# Patient Record
Sex: Male | Born: 1940 | Race: White | Hispanic: No | Marital: Married
Health system: Southern US, Community
[De-identification: ages and names within clinical notes are randomized; demographics above are authoritative.]

## PROBLEM LIST (undated history)

## (undated) DIAGNOSIS — E119 Type 2 diabetes mellitus without complications: Secondary | ICD-10-CM

## (undated) DIAGNOSIS — I10 Essential (primary) hypertension: Secondary | ICD-10-CM

## (undated) DIAGNOSIS — D649 Anemia, unspecified: Secondary | ICD-10-CM

## (undated) HISTORY — PX: CORONARY ANGIOPLASTY WITH STENT PLACEMENT: SHX49

## (undated) HISTORY — PX: APPENDECTOMY: SHX54

## (undated) HISTORY — PX: CHOLECYSTECTOMY: SHX55

## (undated) HISTORY — PX: PROSTATECTOMY: SHX69

## (undated) HISTORY — PX: JOINT REPLACEMENT: SHX530

---

## 2017-10-11 ENCOUNTER — Encounter (HOSPITAL_COMMUNITY): Payer: Self-pay

## 2017-10-11 ENCOUNTER — Emergency Department (HOSPITAL_COMMUNITY): Payer: Medicare Other

## 2017-10-11 ENCOUNTER — Emergency Department (HOSPITAL_COMMUNITY)
Admission: EM | Admit: 2017-10-11 | Discharge: 2017-10-11 | Disposition: A | Discharge status: Home / Self Care | Payer: Medicare Other | Attending: Emergency Medicine | Admitting: Emergency Medicine

## 2017-10-11 ENCOUNTER — Other Ambulatory Visit: Payer: Self-pay

## 2017-10-11 DIAGNOSIS — I10 Essential (primary) hypertension: Secondary | ICD-10-CM | POA: Insufficient documentation

## 2017-10-11 DIAGNOSIS — S161XXA Strain of muscle, fascia and tendon at neck level, initial encounter: Secondary | ICD-10-CM

## 2017-10-11 DIAGNOSIS — Y9389 Activity, other specified: Secondary | ICD-10-CM | POA: Insufficient documentation

## 2017-10-11 DIAGNOSIS — S0990XA Unspecified injury of head, initial encounter: Secondary | ICD-10-CM | POA: Diagnosis present

## 2017-10-11 DIAGNOSIS — Y999 Unspecified external cause status: Secondary | ICD-10-CM | POA: Insufficient documentation

## 2017-10-11 DIAGNOSIS — W19XXXA Unspecified fall, initial encounter: Secondary | ICD-10-CM

## 2017-10-11 DIAGNOSIS — E119 Type 2 diabetes mellitus without complications: Secondary | ICD-10-CM | POA: Diagnosis not present

## 2017-10-11 DIAGNOSIS — W109XXA Fall (on) (from) unspecified stairs and steps, initial encounter: Secondary | ICD-10-CM | POA: Insufficient documentation

## 2017-10-11 DIAGNOSIS — Y9222 Religious institution as the place of occurrence of the external cause: Secondary | ICD-10-CM | POA: Diagnosis not present

## 2017-10-11 DIAGNOSIS — M79605 Pain in left leg: Secondary | ICD-10-CM

## 2017-10-11 HISTORY — DX: Essential (primary) hypertension: I10

## 2017-10-11 HISTORY — DX: Anemia, unspecified: D64.9

## 2017-10-11 HISTORY — DX: Type 2 diabetes mellitus without complications: E11.9

## 2017-10-11 NOTE — ED Triage Notes (Signed)
[place] 10 stairs at [place] when he stumbled on the first stair going down and continued to fall head first down all 10 stairs.  Upon arrival to ED pt has a left forehead hematoma, left hand injury, left knee abrasion.  Pt denies LOC and is not currently on blood thinners.

## 2017-10-11 NOTE — Discharge Instructions (Signed)
You were seen in the Emergency Department (ED) today for a head injury.  Based on your evaluation, you may have sustained a concussion (or bruise) to your brain.  If you had a CT scan done, it did not show any evidence of serious injury or bleeding.   ° °Symptoms to expect from a concussion include nausea, mild to moderate headache, difficulty concentrating or sleeping, and mild lightheadedness.  These symptoms should improve over the next few days to weeks, but it may take many weeks before you feel back to normal.  Return to the emergency department or follow-up with your primary care doctor if your symptoms are not improving over this time. ° °Signs of a more serious head injury include vomiting, severe headache, excessive sleepiness or confusion, and weakness or numbness in your face, arms or legs.  Return immediately to the Emergency Department if you experience any of these more concerning symptoms.   ° °Rest, avoid strenuous physical or mental activity, and avoid activities that could potentially result in another head injury until all your symptoms from this head injury are completely resolved for at least 2-3 weeks.  You may take acetaminophen over the counter according to label instructions for mild headache or scalp soreness. ° °

## 2017-10-11 NOTE — ED Provider Notes (Signed)
Emergency Department Provider Note   I have reviewed the triage vital signs and the nursing notes.   HISTORY  Chief Complaint Fall   HPI Aaron Rodriguez is a 77 y.o. male with PMH of DM and HTN to the emergency department for evaluation after mechanical fall down approximately 10 stairs.  The patient was talking with someone at church when he turned in his right foot missed the step and he began to fall.  Patient states that he tumbled down the carpeted stairs and hit his head, left leg, and left hand on the way down.  Reports only mild pain in these areas.  EMS was called and the patient was placed in a cervical spine collar and transported to the emergency department.  Denies any pain in the chest, abdomen, back, hips, or shoulders.  No numbness or tingling.  Patient did not lose consciousness.  No confusion, nausea, vomiting since the event.  Patient is compliant with his a full dose aspirin at home.   Past Medical History:  Diagnosis Date  . Anemia   . Diabetes mellitus without complication (HCC)   . Hypertension     There are no active problems to display for this patient.   Allergies Penicillins and Plavix [clopidogrel bisulfate]  No family history on file.  Social History Social History   Tobacco Use  . Smoking status: Not on file  Substance Use Topics  . Alcohol use: No    Frequency: Never  . Drug use: No    Review of Systems  Constitutional: No fever/chills Eyes: No visual changes. ENT: No sore throat. Cardiovascular: Denies chest pain. Respiratory: Denies shortness of breath. Gastrointestinal: No abdominal pain.  No nausea, no vomiting.  No diarrhea.  No constipation. Genitourinary: Negative for dysuria. Musculoskeletal: Negative for back pain. Positive left lower leg pain and mild left hand pain.  Skin: Negative for rash. Neurological: Negative for focal weakness or numbness. Positive HA.   10-point ROS otherwise  negative.  ____________________________________________   PHYSICAL EXAM:  VITAL SIGNS: ED Triage Vitals  Enc Vitals Group     BP 10/11/17 1310 (!) 156/90     Pulse Rate 10/11/17 1310 74     Resp 10/11/17 1310 18     Temp --      Temp src --      SpO2 10/11/17 1315 98 %     Weight 10/11/17 1310 200 lb (90.7 kg)     Height 10/11/17 1310 5\' 9"  (1.753 m)     Pain Score 10/11/17 1310 2   Constitutional: Alert and oriented. Well appearing and in no acute distress. Eyes: Conjunctivae are normal. Head: Abrasion and swelling over the forehead. No laceration.  Nose: No congestion/rhinnorhea. Mouth/Throat: Mucous membranes are moist.  Oropharynx non-erythematous. Neck: No stridor. C-collar in place.  Cardiovascular: Normal rate, regular rhythm. Good peripheral circulation. Grossly normal heart sounds.   Respiratory: Normal respiratory effort.  No retractions. Lungs CTAB. Gastrointestinal: Soft and nontender. No distention.  Musculoskeletal: No lower extremity tenderness nor edema. No gross deformities of extremities. No tenderness to palpation of the hand and wrists bilaterally. No snuff box tenderness.  Neurologic:  Normal speech and language. No gross focal neurologic deficits are appreciated.  Skin:  Skin is warm and dry. Abrasion to the left anterior lower leg w/o laceration.   ____________________________________________  RADIOLOGY  Dg Tibia/fibula Left  Result Date: 10/11/2017 CLINICAL DATA:  Left leg pain.  Fell on stairs EXAM: LEFT TIBIA AND FIBULA - 2 VIEW  COMPARISON:  None. FINDINGS: Left knee replacement satisfactory position alignment. Negative for fracture.  No acute skeletal abnormality. IMPRESSION: Satisfactory left knee replacement.  Negative for fracture. Electronically Signed   By: Marlan Palau M.D.   On: 10/11/2017 14:27   Ct Head Wo Contrast  Result Date: 10/11/2017 CLINICAL DATA:  Tripped and fell down steps. Head pain. Initial encounter. EXAM: CT HEAD WITHOUT  CONTRAST CT CERVICAL SPINE WITHOUT CONTRAST TECHNIQUE: Multidetector CT imaging of the head and cervical spine was performed following the standard protocol without intravenous contrast. Multiplanar CT image reconstructions of the cervical spine were also generated. COMPARISON:  None. FINDINGS: CT HEAD FINDINGS Brain: There is no evidence of acute infarct, intracranial hemorrhage, mass, midline shift, or extra-axial fluid collection. There is mild cerebral atrophy and mild chronic small vessel ischemic disease in the cerebral white matter. Vascular: Calcified atherosclerosis at the skullbase. No hyperdense vessel. Skull: No fracture or focal osseous lesion. Sinuses/Orbits: Postsurgical changes in the paranasal sinuses with scattered mucosal thickening, moderate in the left maxillary sinus. Clear mastoid air cells. Unremarkable orbits. Other: Small left forehead scalp hematoma. CT CERVICAL SPINE FINDINGS Alignment: Normal. Skull base and vertebrae: No acute fracture or destructive osseous process. Soft tissues and spinal canal: No prevertebral fluid or swelling. No visible canal hematoma. Disc levels: Relatively mild diffuse cervical spondylosis for age. Moderate multilevel facet arthrosis. Moderate neural foraminal stenosis at C3-4, C4-5, and C5-6 due to uncovertebral spurring. No gross high-grade spinal stenosis. Upper chest: Clear lung apices. Other: Asymmetric enlargement of the right thyroid lobe without discrete dominant nodule. Mild calcified atherosclerosis at the carotid bifurcations. Numerous bilateral tonsillar calcifications. IMPRESSION: 1. No evidence of acute intracranial abnormality. 2. Mild cerebral atrophy and mild chronic small vessel ischemic disease. 3. Small forehead hematoma. 4. No acute fracture or subluxation identified in the cervical spine. Electronically Signed   By: Sebastian Ache M.D.   On: 10/11/2017 14:53   Ct Cervical Spine Wo Contrast  Result Date: 10/11/2017 CLINICAL DATA:  Tripped  and fell down steps. Head pain. Initial encounter. EXAM: CT HEAD WITHOUT CONTRAST CT CERVICAL SPINE WITHOUT CONTRAST TECHNIQUE: Multidetector CT imaging of the head and cervical spine was performed following the standard protocol without intravenous contrast. Multiplanar CT image reconstructions of the cervical spine were also generated. COMPARISON:  None. FINDINGS: CT HEAD FINDINGS Brain: There is no evidence of acute infarct, intracranial hemorrhage, mass, midline shift, or extra-axial fluid collection. There is mild cerebral atrophy and mild chronic small vessel ischemic disease in the cerebral white matter. Vascular: Calcified atherosclerosis at the skullbase. No hyperdense vessel. Skull: No fracture or focal osseous lesion. Sinuses/Orbits: Postsurgical changes in the paranasal sinuses with scattered mucosal thickening, moderate in the left maxillary sinus. Clear mastoid air cells. Unremarkable orbits. Other: Small left forehead scalp hematoma. CT CERVICAL SPINE FINDINGS Alignment: Normal. Skull base and vertebrae: No acute fracture or destructive osseous process. Soft tissues and spinal canal: No prevertebral fluid or swelling. No visible canal hematoma. Disc levels: Relatively mild diffuse cervical spondylosis for age. Moderate multilevel facet arthrosis. Moderate neural foraminal stenosis at C3-4, C4-5, and C5-6 due to uncovertebral spurring. No gross high-grade spinal stenosis. Upper chest: Clear lung apices. Other: Asymmetric enlargement of the right thyroid lobe without discrete dominant nodule. Mild calcified atherosclerosis at the carotid bifurcations. Numerous bilateral tonsillar calcifications. IMPRESSION: 1. No evidence of acute intracranial abnormality. 2. Mild cerebral atrophy and mild chronic small vessel ischemic disease. 3. Small forehead hematoma. 4. No acute fracture or subluxation identified in the  cervical spine. Electronically Signed   By: Sebastian Ache M.D.   On: 10/11/2017 14:53     ____________________________________________   PROCEDURES  Procedure(s) performed:   Procedures  None ____________________________________________   INITIAL IMPRESSION / ASSESSMENT AND PLAN / ED COURSE  Pertinent labs & imaging results that were available during my care of the patient were reviewed by me and considered in my medical decision making (see chart for details).  Patient presents to the emergency department for evaluation after mechanical fall down approximately 10 stairs.  He has an abrasion over his left lower leg and forehead.  He is on full dose aspirin but not anticoagulated.  Given the amount of stairs plan for CT imaging of the head and cervical spine along with plain film of the left lower extremity.  Patient's left thumb has some mild abrasion over it but has full range of motion without bony tenderness to palpation.  Normal exam of the wrist without any scaphoid tenderness.  No labs.  Fall was mechanical with no loss of consciousness.   Imaging negative. Discussed return precautions and wound care plan. Discussed symptoms of delayed head bleed and concussion. Plan for close PCP follow up after discharge.   At this time, I do not feel there is any life-threatening condition present. I have reviewed and discussed all results (EKG, imaging, lab, urine as appropriate), exam findings with patient. I have reviewed nursing notes and appropriate previous records.  I feel the patient is safe to be discharged home without further emergent workup. Discussed usual and customary return precautions. Patient and family (if present) verbalize understanding and are comfortable with this plan.  Patient will follow-up with their primary care provider. If they do not have a primary care provider, information for follow-up has been provided to them. All questions have been answered.  ____________________________________________  FINAL CLINICAL IMPRESSION(S) / ED DIAGNOSES  Final  diagnoses:  Fall, initial encounter  Injury of head, initial encounter  Left leg pain  Strain of neck muscle, initial encounter    Note:  This document was prepared using Dragon voice recognition software and may include unintentional dictation errors.  Alona Bene, MD Emergency Medicine   Long, Arlyss Repress, MD 10/11/17 [phone]

## 2017-10-11 NOTE — ED Notes (Signed)
Pt taken to Radiology at this time 

## 2018-07-02 IMAGING — CT CT HEAD W/O CM
3 of 7 series · 15 of 47 positions shown, 18 images · non-contrast
Comparison: None.

CLINICAL DATA: Tripped and fell down steps. Head pain. Initial
encounter.

EXAM:
CT HEAD WITHOUT CONTRAST
CT CERVICAL SPINE WITHOUT CONTRAST
TECHNIQUE: Multidetector CT imaging of the head and cervical spine was
performed following the standard protocol without intravenous
contrast. Multiplanar CT image reconstructions of the cervical spine
were also generated.

[Series 6: head 3.0 mpr cor · coronal · 0.32mm/px · 3 of 72 slices shown]
[im 21/72  brain]
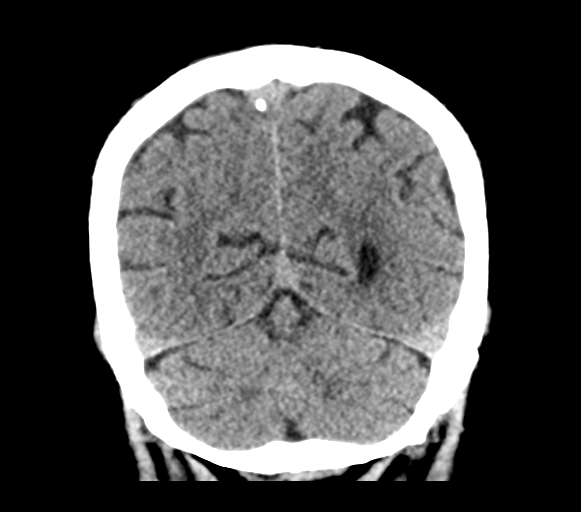
[im 31/72  brain]
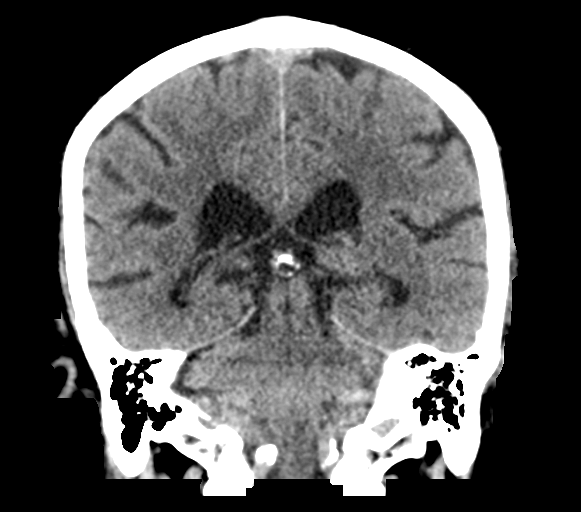
[im 41/72  brain]
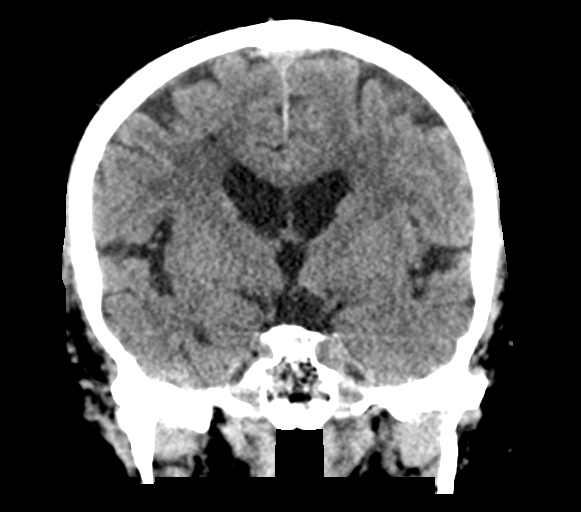

[Series 7: head 3.0 mpr sag · sagittal · 0.32mm/px · 1 of 61 slices shown]
[im 31/61  brain]
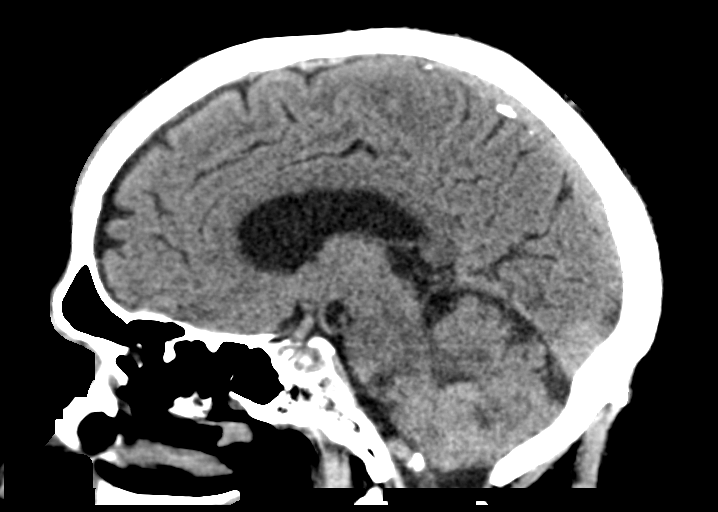

[Series 15: orthogonal axial st · axial · 0.21mm/px · z∈[-302,-141]mm · 11 of 103 slices shown, 14 images]
[im 9/103  brain]
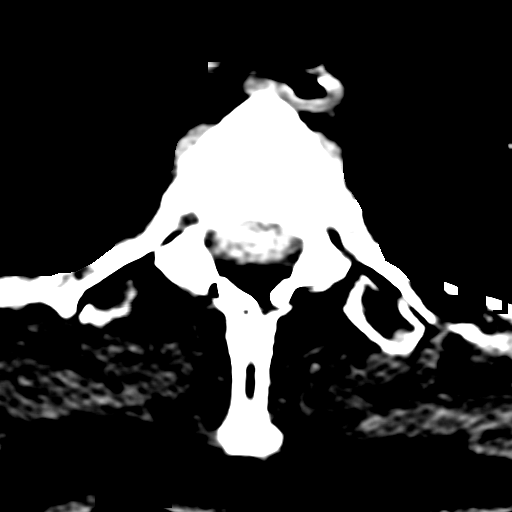
[im 9/103  bone]
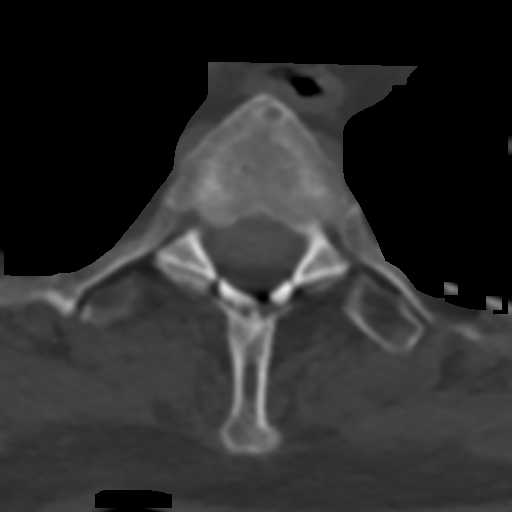
[im 18/103  brain]
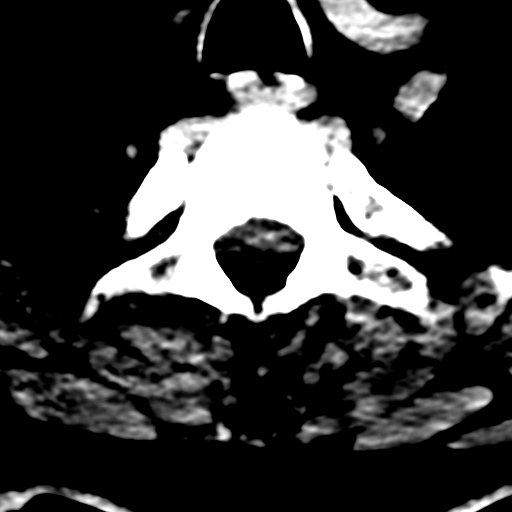
[im 26/103  brain]
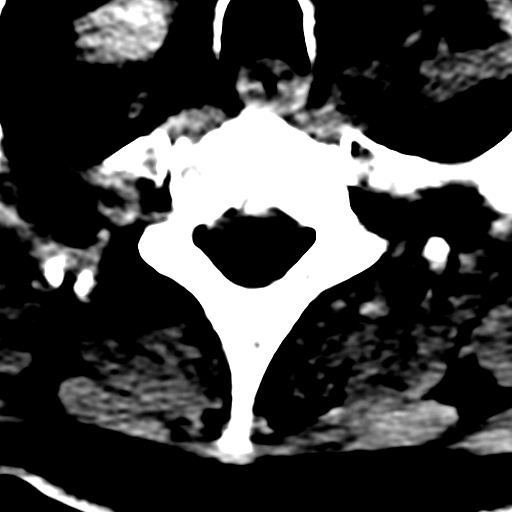
[im 35/103  brain]
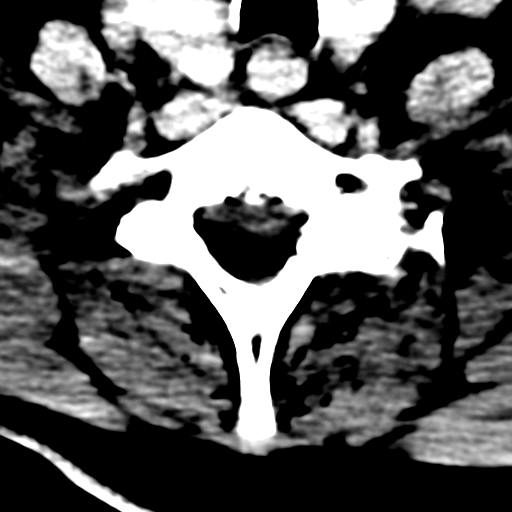
[im 43/103  brain]
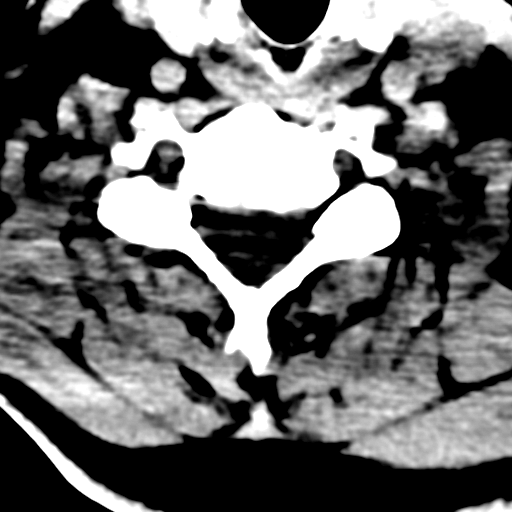
[im 43/103  bone]
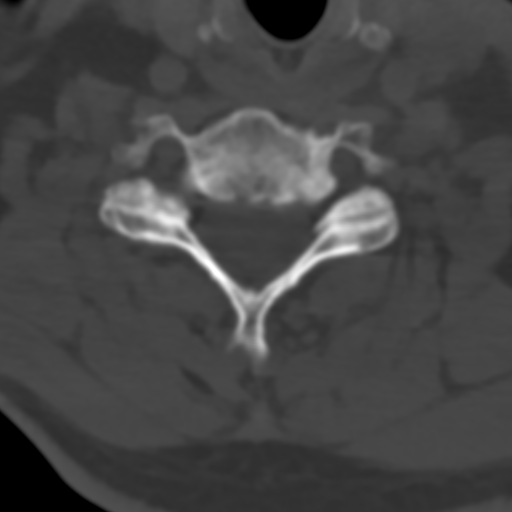
[im 52/103  brain]
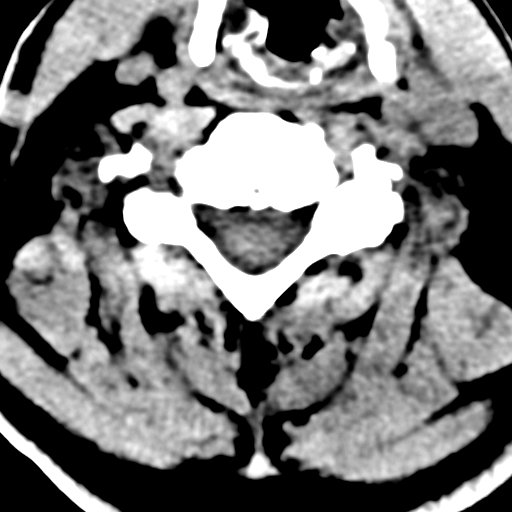
[im 60/103  brain]
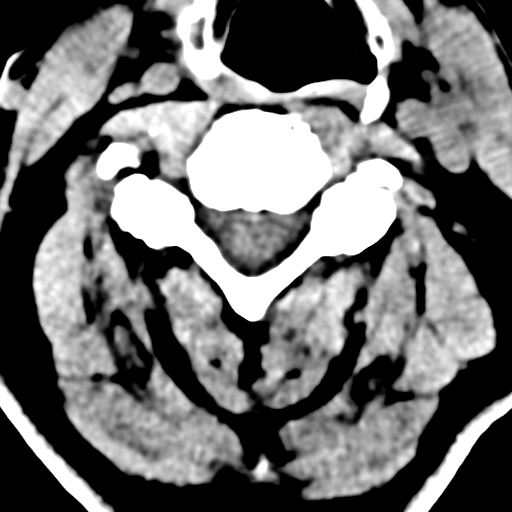
[im 69/103  brain]
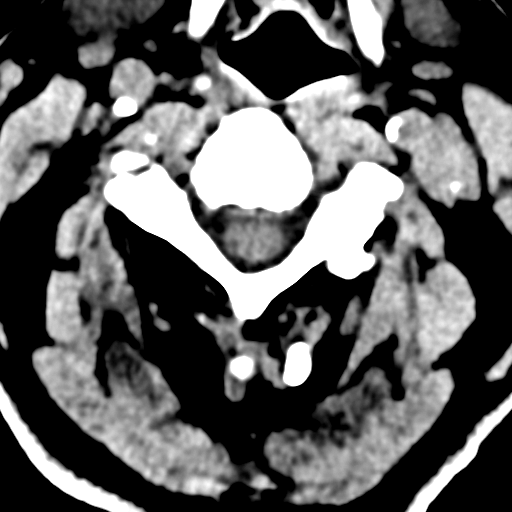
[im 77/103  brain]
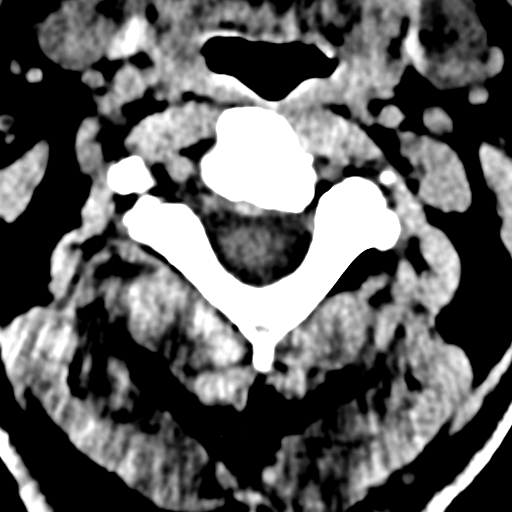
[im 77/103  bone]
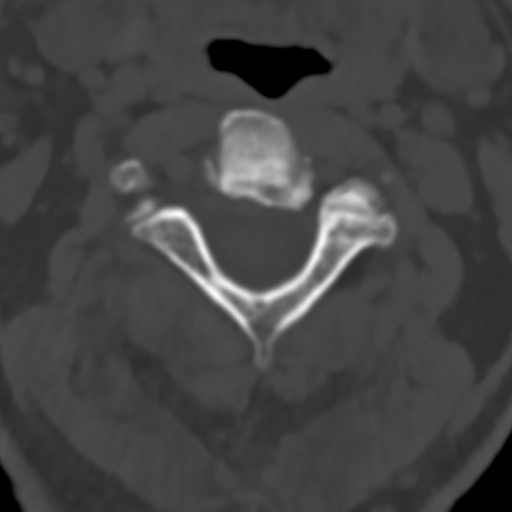
[im 86/103  brain]
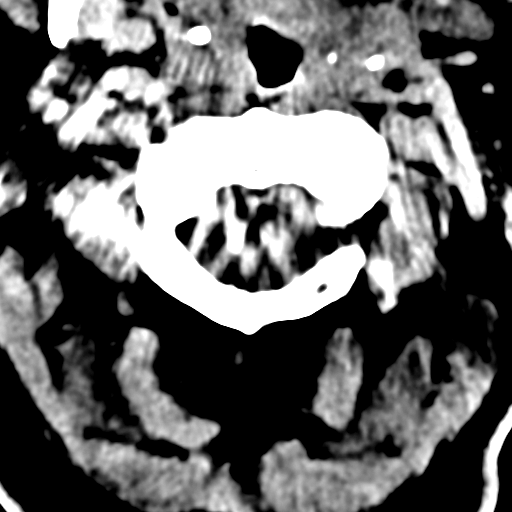
[im 94/103  brain]
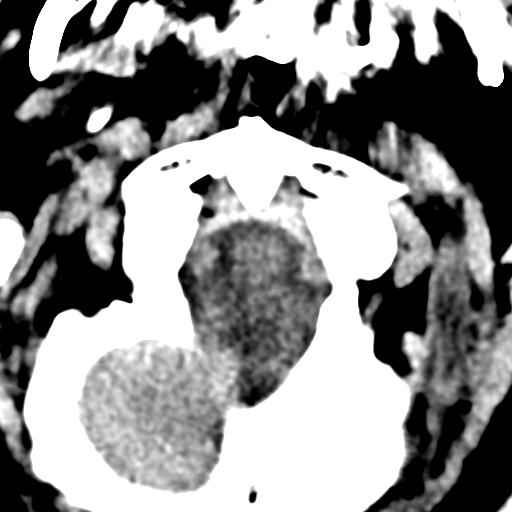

[15 of 47 positions shown; findings below may reference images not displayed]

FINDINGS: CT HEAD FINDINGS

Brain: There is no evidence of acute infarct, intracranial
hemorrhage, mass, midline shift, or extra-axial fluid collection.
There is mild cerebral atrophy and mild chronic small vessel
ischemic disease in the cerebral white matter.

Vascular: Calcified atherosclerosis at the skullbase. No hyperdense
vessel.

Skull: No fracture or focal osseous lesion.

Sinuses/Orbits: Postsurgical changes in the paranasal sinuses with
scattered mucosal thickening, moderate in the left maxillary sinus.
Clear mastoid air cells. Unremarkable orbits.

Other: Small left forehead scalp hematoma.

CT CERVICAL SPINE FINDINGS

Alignment: Normal.

Skull base and vertebrae: No acute fracture or destructive osseous
process.

Soft tissues and spinal canal: No prevertebral fluid or swelling. No
visible canal hematoma.

Disc levels: Relatively mild diffuse cervical spondylosis for age.
Moderate multilevel facet arthrosis. Moderate neural foraminal
stenosis at C3-4, C4-5, and C5-6 due to uncovertebral spurring. No
gross high-grade spinal stenosis.

Upper chest: Clear lung apices.

Other: Asymmetric enlargement of the right thyroid lobe without
discrete dominant nodule. Mild calcified atherosclerosis at the
carotid bifurcations. Numerous bilateral tonsillar calcifications.
IMPRESSION: 1. No evidence of acute intracranial abnormality.
2. Mild cerebral atrophy and mild chronic small vessel ischemic
disease.
3. Small forehead hematoma.
4. No acute fracture or subluxation identified in the cervical
spine.

## 2020-11-05 DEATH — deceased
